# Patient Record
Sex: Female | Born: 1935 | Race: White | Hispanic: No | Marital: Single | Smoking: Never smoker
Health system: Southern US, Community
[De-identification: ages and names within clinical notes are randomized; demographics above are authoritative.]

## PROBLEM LIST (undated history)

## (undated) DIAGNOSIS — I10 Essential (primary) hypertension: Secondary | ICD-10-CM

## (undated) DIAGNOSIS — F329 Major depressive disorder, single episode, unspecified: Secondary | ICD-10-CM

## (undated) DIAGNOSIS — F32A Depression, unspecified: Secondary | ICD-10-CM

---

## 2016-06-10 ENCOUNTER — Encounter (INDEPENDENT_AMBULATORY_CARE_PROVIDER_SITE_OTHER): Payer: Self-pay | Admitting: Ophthalmology

## 2017-06-24 ENCOUNTER — Emergency Department (HOSPITAL_COMMUNITY): Payer: Medicare HMO

## 2017-06-24 ENCOUNTER — Encounter (HOSPITAL_COMMUNITY): Payer: Self-pay | Admitting: Emergency Medicine

## 2017-06-24 ENCOUNTER — Emergency Department (HOSPITAL_COMMUNITY)
Admission: EM | Admit: 2017-06-24 | Discharge: 2017-06-24 | Disposition: A | Discharge status: Home / Self Care | Payer: Medicare HMO | Attending: Emergency Medicine | Admitting: Emergency Medicine

## 2017-06-24 DIAGNOSIS — I110 Hypertensive heart disease with heart failure: Secondary | ICD-10-CM | POA: Insufficient documentation

## 2017-06-24 DIAGNOSIS — R0602 Shortness of breath: Secondary | ICD-10-CM | POA: Diagnosis present

## 2017-06-24 DIAGNOSIS — I5032 Chronic diastolic (congestive) heart failure: Secondary | ICD-10-CM

## 2017-06-24 DIAGNOSIS — Z79899 Other long term (current) drug therapy: Secondary | ICD-10-CM | POA: Diagnosis not present

## 2017-06-24 DIAGNOSIS — F329 Major depressive disorder, single episode, unspecified: Secondary | ICD-10-CM | POA: Insufficient documentation

## 2017-06-24 HISTORY — DX: Essential (primary) hypertension: I10

## 2017-06-24 HISTORY — DX: Depression, unspecified: F32.A

## 2017-06-24 HISTORY — DX: Major depressive disorder, single episode, unspecified: F32.9

## 2017-06-24 LAB — CBC WITH DIFFERENTIAL/PLATELET
Basophils Absolute: 0 10*3/uL (ref 0.0–0.1)
Basophils Relative: 0 %
EOS PCT: 1 %
Eosinophils Absolute: 0.1 10*3/uL (ref 0.0–0.7)
HCT: 35.3 % — ABNORMAL LOW (ref 36.0–46.0)
Hemoglobin: 11.3 g/dL — ABNORMAL LOW (ref 12.0–15.0)
LYMPHS ABS: 0.7 10*3/uL (ref 0.7–4.0)
Lymphocytes Relative: 12 %
MCH: 34 pg (ref 26.0–34.0)
MCHC: 32 g/dL (ref 30.0–36.0)
MCV: 106.3 fL — AB (ref 78.0–100.0)
MONOS PCT: 9 %
Monocytes Absolute: 0.6 10*3/uL (ref 0.1–1.0)
Neutro Abs: 4.8 10*3/uL (ref 1.7–7.7)
Neutrophils Relative %: 78 %
PLATELETS: 248 10*3/uL (ref 150–400)
RBC: 3.32 MIL/uL — AB (ref 3.87–5.11)
RDW: 18.2 % — ABNORMAL HIGH (ref 11.5–15.5)
WBC: 6.1 10*3/uL (ref 4.0–10.5)

## 2017-06-24 LAB — BASIC METABOLIC PANEL
Anion gap: 13 (ref 5–15)
BUN: 31 mg/dL — AB (ref 6–20)
CHLORIDE: 97 mmol/L — AB (ref 101–111)
CO2: 28 mmol/L (ref 22–32)
Calcium: 8.6 mg/dL — ABNORMAL LOW (ref 8.9–10.3)
Creatinine, Ser: 1.23 mg/dL — ABNORMAL HIGH (ref 0.44–1.00)
GFR calc Af Amer: 42 mL/min — ABNORMAL LOW (ref >60)
GFR calc non Af Amer: 37 mL/min — ABNORMAL LOW (ref >60)
Glucose, Bld: 121 mg/dL — ABNORMAL HIGH (ref 65–99)
POTASSIUM: 3.9 mmol/L (ref 3.5–5.1)
SODIUM: 138 mmol/L (ref 135–145)

## 2017-06-24 LAB — I-STAT TROPONIN, ED: Troponin i, poc: 0.03 ng/mL (ref 0.00–0.08)

## 2017-06-24 LAB — BRAIN NATRIURETIC PEPTIDE: B Natriuretic Peptide: 2383.9 pg/mL — ABNORMAL HIGH (ref 0.0–100.0)

## 2017-06-24 MED ORDER — POTASSIUM CHLORIDE CRYS ER 20 MEQ PO TBCR
40.0000 meq | EXTENDED_RELEASE_TABLET | Freq: Once | ORAL | Status: AC
Start: 2017-06-24 — End: 2017-06-24
  Administered 2017-06-24: 40 meq via ORAL
  Filled 2017-06-24: qty 2

## 2017-06-24 MED ORDER — FUROSEMIDE 20 MG PO TABS: 60.0000 mg | ORAL_TABLET | Freq: Three times a day (TID) | ORAL | 0 refills | Status: AC

## 2017-06-24 MED ORDER — FUROSEMIDE 10 MG/ML IJ SOLN
40.0000 mg | Freq: Once | INTRAMUSCULAR | Status: AC
  Administered 2017-06-24: 40 mg via INTRAVENOUS
  Filled 2017-06-24: qty 4

## 2017-06-24 NOTE — ED Provider Notes (Addendum)
[facility] EMERGENCY DEPARTMENT Provider Note   CSN: [id number] Arrival date & time: 06/24/17  1041     History   Chief Complaint Chief Complaint  Patient presents with  . Shortness of Breath    HPI Pamela Morrison is a 84 y.o. female with a past medical history of severe mitral regurgitation, CHF, hypertension, hyperlipidemia, CKD stage III, who presents to ED for evaluation of 2-day history of worsening shortness of breath and bilateral lower extremity edema.  Patient reports ongoing shortness of breath since hospitalization in January 2019 for acute pulmonary edema.  She states that shortness of breath is worse with "any type of movement."  Shortness of breath improves with rest.  She reports associated nasal congestion.  Denies any chest pain, hemoptysis, fever, dysuria, abdominal pain, vomiting.  Patient currently resides in assisted living facility.  She has been on oxygen via nasal cannula 2 L at all times since being discharged in January.  HPI  Past Medical History:  Diagnosis Date  . Depression   . Hypertension     There are no active problems to display for this patient.      OB History   None      Home Medications    Prior to Admission medications   Medication Sig Start Date End Date Taking? Authorizing Provider  brimonidine-timolol (COMBIGAN) 0.2-0.5 % ophthalmic solution Place 1 drop into the left eye every 12 (twelve) hours.   Yes [provider]  Cholecalciferol 5000 units TABS Take 5,000 Units by mouth daily.   Yes [provider]  dorzolamide (TRUSOPT) 2 % ophthalmic solution Place 1 drop into the right eye 2 (two) times daily.   Yes [provider]  DULoxetine (CYMBALTA) 30 MG capsule Take 30 mg by mouth daily.   Yes [provider]  ferrous sulfate 325 (65 FE) MG tablet Take 325 mg by mouth daily with breakfast.   Yes [provider]  folic acid (FOLVITE) 1 MG tablet Take 1 mg by mouth  daily.   Yes [provider]  furosemide (LASIX) 20 MG tablet Take 60 mg by mouth 2 (two) times daily.   Yes [provider]  lovastatin (MEVACOR) 20 MG tablet Take 20 mg by mouth at bedtime.   Yes [provider]  methotrexate (RHEUMATREX) 2.5 MG tablet Take 7.5 mg by mouth once a week. Caution:Chemotherapy. Protect from light. Pt takes on Thursday of each week   Yes [provider]  Multiple Vitamin (DAILY VITAMIN PO) Take 1 tablet by mouth daily.   Yes [provider]  Multiple Vitamins-Minerals (ICAPS PO) Take 1 capsule by mouth daily.   Yes [provider]  nebivolol (BYSTOLIC) 5 MG tablet Take 5 mg by mouth daily.   Yes [provider]  vitamin C (ASCORBIC ACID) 500 MG tablet Take 500 mg by mouth daily.   Yes [provider]    Family History No family history on file.  Social History Social History   Tobacco Use  . Smoking status: Never Smoker  . Smokeless tobacco: Never Used  Substance Use Topics  . Alcohol use: Not on file  . Drug use: Not on file     Allergies   Asa [aspirin]; Food; and Shrimp [shellfish allergy]   Review of Systems Review of Systems  Constitutional: Negative for appetite change, chills and fever.  HENT: Positive for congestion. Negative for ear pain, rhinorrhea, sneezing and sore throat.   Eyes: Negative for photophobia and  visual disturbance.  Respiratory: Positive for shortness of breath. Negative for cough, chest tightness and wheezing.   Cardiovascular: Negative for chest pain and palpitations.  Gastrointestinal: Negative for abdominal pain, blood in stool, constipation, diarrhea, nausea and vomiting.  Genitourinary: Negative for dysuria, hematuria and urgency.  Musculoskeletal: Negative for myalgias.  Skin: Negative for rash.  Neurological: Negative for dizziness, weakness and light-headedness.     Physical Exam Updated Vital Signs BP 105/76   Pulse 87   Temp 98.1  F (36.7 C)   Resp (!) 25   SpO2 93%   Physical Exam  Constitutional: She is oriented to person, place, and time. She appears well-developed and well-nourished. No distress.  Oxygen being delivered via nasal cannula.  HENT:  Head: Normocephalic and atraumatic.  Nose: Nose normal.  Eyes: Pupils are equal, round, and reactive to light. Conjunctivae and EOM are normal. Right eye exhibits no discharge. Left eye exhibits no discharge. No scleral icterus.  Neck: Normal range of motion. Neck supple.  Cardiovascular: Normal rate, regular rhythm, normal heart sounds and intact distal pulses. Exam reveals no gallop and no friction rub.  No murmur heard. Pulmonary/Chest: Effort normal and breath sounds normal. No respiratory distress.  Coarse breath sounds in bilateral lung fields.  Abdominal: Soft. Bowel sounds are normal. She exhibits no distension. There is no tenderness. There is no guarding.  Musculoskeletal: Normal range of motion.       Right lower leg: She exhibits edema.       Left lower leg: She exhibits edema.  Mild 1+ pitting edema in BLE, R slightly > L. No calf tenderness bilaterally.  Neurological: She is alert and oriented to person, place, and time. No cranial nerve deficit or sensory deficit. She exhibits normal muscle tone. Coordination normal.  Alert and oriented to self only. Patient states, "I haven't written a check since before January, so how would I know what day it is?"  PERRL.  No facial asymmetry noted.  Skin: Skin is warm and dry. No rash noted.  Psychiatric: She has a normal mood and affect.  Nursing note and vitals reviewed.    ED Treatments / Results  Labs (all labs ordered are listed, but only abnormal results are displayed) Labs Reviewed  BASIC METABOLIC PANEL - Abnormal; Notable for the following components:      Result Value   Chloride 97 (*)    Glucose, Bld 121 (*)    BUN 31 (*)    Creatinine, Ser 1.23 (*)    Calcium 8.6 (*)    GFR calc non Af Amer  37 (*)    GFR calc Af Amer 42 (*)    All other components within normal limits  CBC WITH DIFFERENTIAL/PLATELET - Abnormal; Notable for the following components:   RBC 3.32 (*)    Hemoglobin 11.3 (*)    HCT 35.3 (*)    MCV 106.3 (*)    RDW 18.2 (*)    All other components within normal limits  BRAIN NATRIURETIC PEPTIDE - Abnormal; Notable for the following components:   B Natriuretic Peptide 2,383.9 (*)    All other components within normal limits  URINALYSIS, ROUTINE W REFLEX MICROSCOPIC  I-STAT TROPONIN, ED    EKG EKG Interpretation  Date/Time:  Friday Jun 24 2017 10:49:01 EDT Ventricular Rate:  84 PR Interval:    QRS Duration: 82 QT Interval:  441 QTC Calculation: 522 R Axis:   162 Text Interpretation:  Atrial fibrillation Probable lateral infarct, age indeterminate Prolonged QT  interval Confirmed by Raeford Razor [phone]) on 06/24/2017 12:10:38 PM   Radiology Dg Chest 2 View  Result Date: 06/24/2017 CLINICAL DATA:  Intermittent shortness of breath over the last several months, worsening recently. EXAM: CHEST - 2 VIEW COMPARISON:  03/05/2017 and multiple priors as distant as 04/19/2014 FINDINGS: Artifact overlies the chest. Chronic cardiomegaly and aortic atherosclerosis. Chronic diffuse interstitial lung disease, baseline appearance without evidence of superimposed acute consolidation, collapse or effusion. Chronic degenerative changes affect the spine with old compression fractures at the thoracolumbar junction. IMPRESSION: No active disease identifiable by radiography. Chronic cardiomegaly and aortic atherosclerosis. Chronic widespread interstitial lung disease. Electronically Signed   By: Paulina Fusi M.D.   On: 06/24/2017 12:47    Procedures Procedures (including critical care time)  Medications Ordered in ED Medications  potassium chloride SA (K-DUR,KLOR-CON) CR tablet 40 mEq (has no administration in time range)  furosemide (LASIX) injection 40 mg (40 mg Intravenous  Given 06/24/17 1414)     Initial Impression / Assessment and Plan / ED Course  I have reviewed the triage vital signs and the nursing notes.  Pertinent labs & imaging results that were available during my care of the patient were reviewed by me and considered in my medical decision making (see chart for details).     Patient presents to ED for evaluation of Shortness of breath, bilateral lower extremity edema.  Patient's symptoms began earlier this year in January where she had to be admitted to [facility] for pulmonary edema, acute on chronic kidney disease, elevation in troponin.  Since then she now resides in an assisted living facility and no longer at home.  Patient is currently on 20 mg of Lasix twice daily for his CHF.  On physical exam she is overall well-appearing.  She is on 2 L of oxygen via nasal cannula.  She is not tachycardic or tachypneic.  She has bilateral lower extremity edema with no calf tenderness.  Lab work shows normal troponin, BNP elevated at 2000.  CBC unremarkable.  BMP with normal potassium and otherwise unremarkable as well.  EKG shows atrial fibrillation, which patient has a history of. Patient given 40 mg of IV Lasix.  She currently takes a total of 120 mg of Lasix p.o. daily.  This was increased last month from 80 mg. Will ask to increase her Lasix  three times daily. She has an appointment with her cardiologist next week.  Advised to return for any severe worsening symptoms. Patient discussed with and seen by my attending, Dr. Juleen China.  Portions of this note were generated with Scientist, clinical (histocompatibility and immunogenetics). Dictation errors may occur despite best attempts at proofreading.    Final Clinical Impressions(s) / ED Diagnoses   Final diagnoses:  Chronic diastolic congestive heart failure [facility])    ED Discharge Orders    None        Dietrich Pates, PA-C 06/24/17 1514    Raeford Razor, MD 06/27/17 [phone]

## 2017-06-24 NOTE — ED Notes (Signed)
Pt given ginger ale , son and wife here to speak to PA

## 2017-06-24 NOTE — ED Triage Notes (Signed)
Sob ankle swelling x 3 days a fib per ems, cannot complete full sentence  Sob no fever

## 2017-06-24 NOTE — Discharge Instructions (Addendum)
Take  Lasix 3 times daily for the next 4 days. Follow-up with your primary care provider and cardiologist for further evaluation.

## 2017-06-24 NOTE — ED Notes (Signed)
[facility] arrived to transport patient. All documents given to [facility]. Family notified

## 2017-06-24 NOTE — ED Notes (Signed)
CALLED [facility]  

## 2017-06-24 NOTE — ED Notes (Signed)
[facility] called for report- [facility] called to transport patient

## 2017-06-24 NOTE — ED Notes (Signed)
[facility] Called 

## 2017-07-16 DEATH — deceased

## 2018-10-03 IMAGING — CR DG CHEST 2V
2 series · 2 of 2 positions shown · non-contrast
Comparison: 03/05/2017 and multiple priors as distant as 04/19/2014

CLINICAL DATA: Intermittent shortness of breath over the last
several months, worsening recently.

EXAM:
CHEST - 2 VIEW

[chest lat]
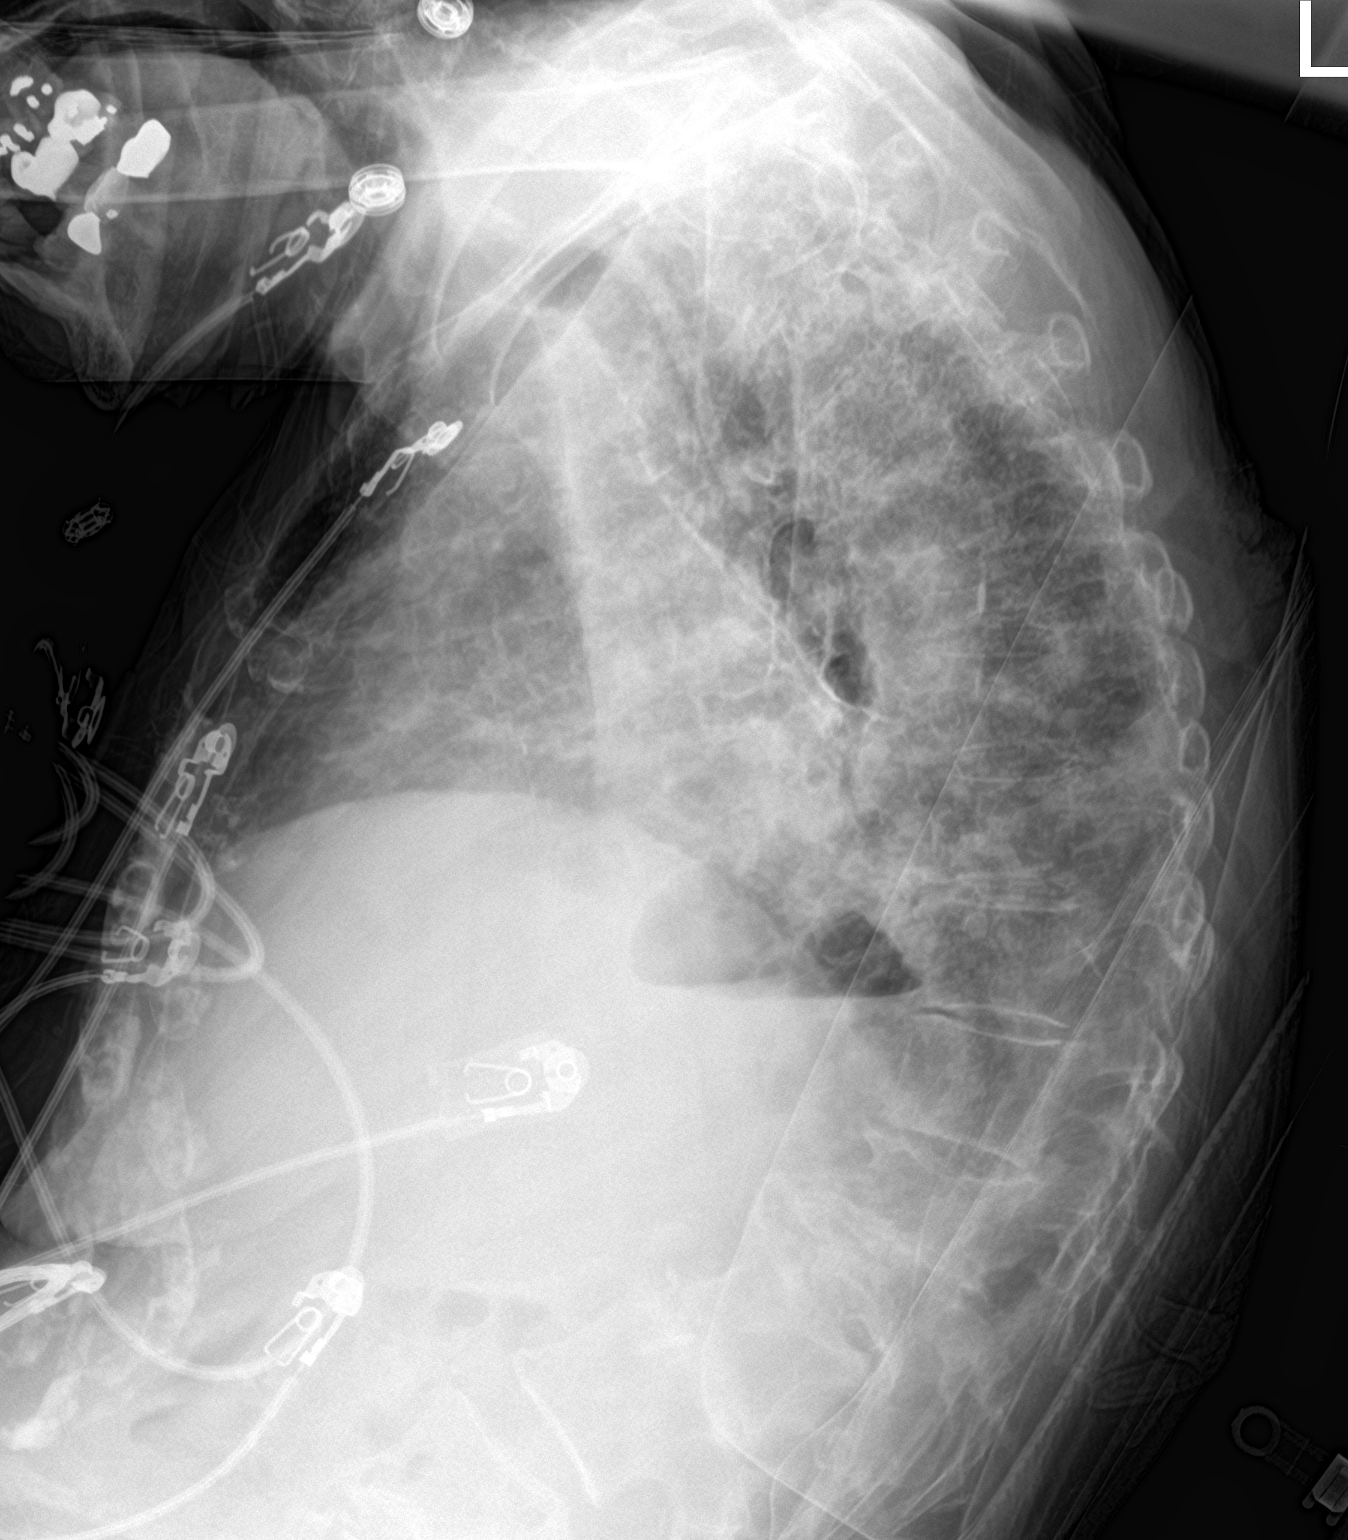

[chest ap]
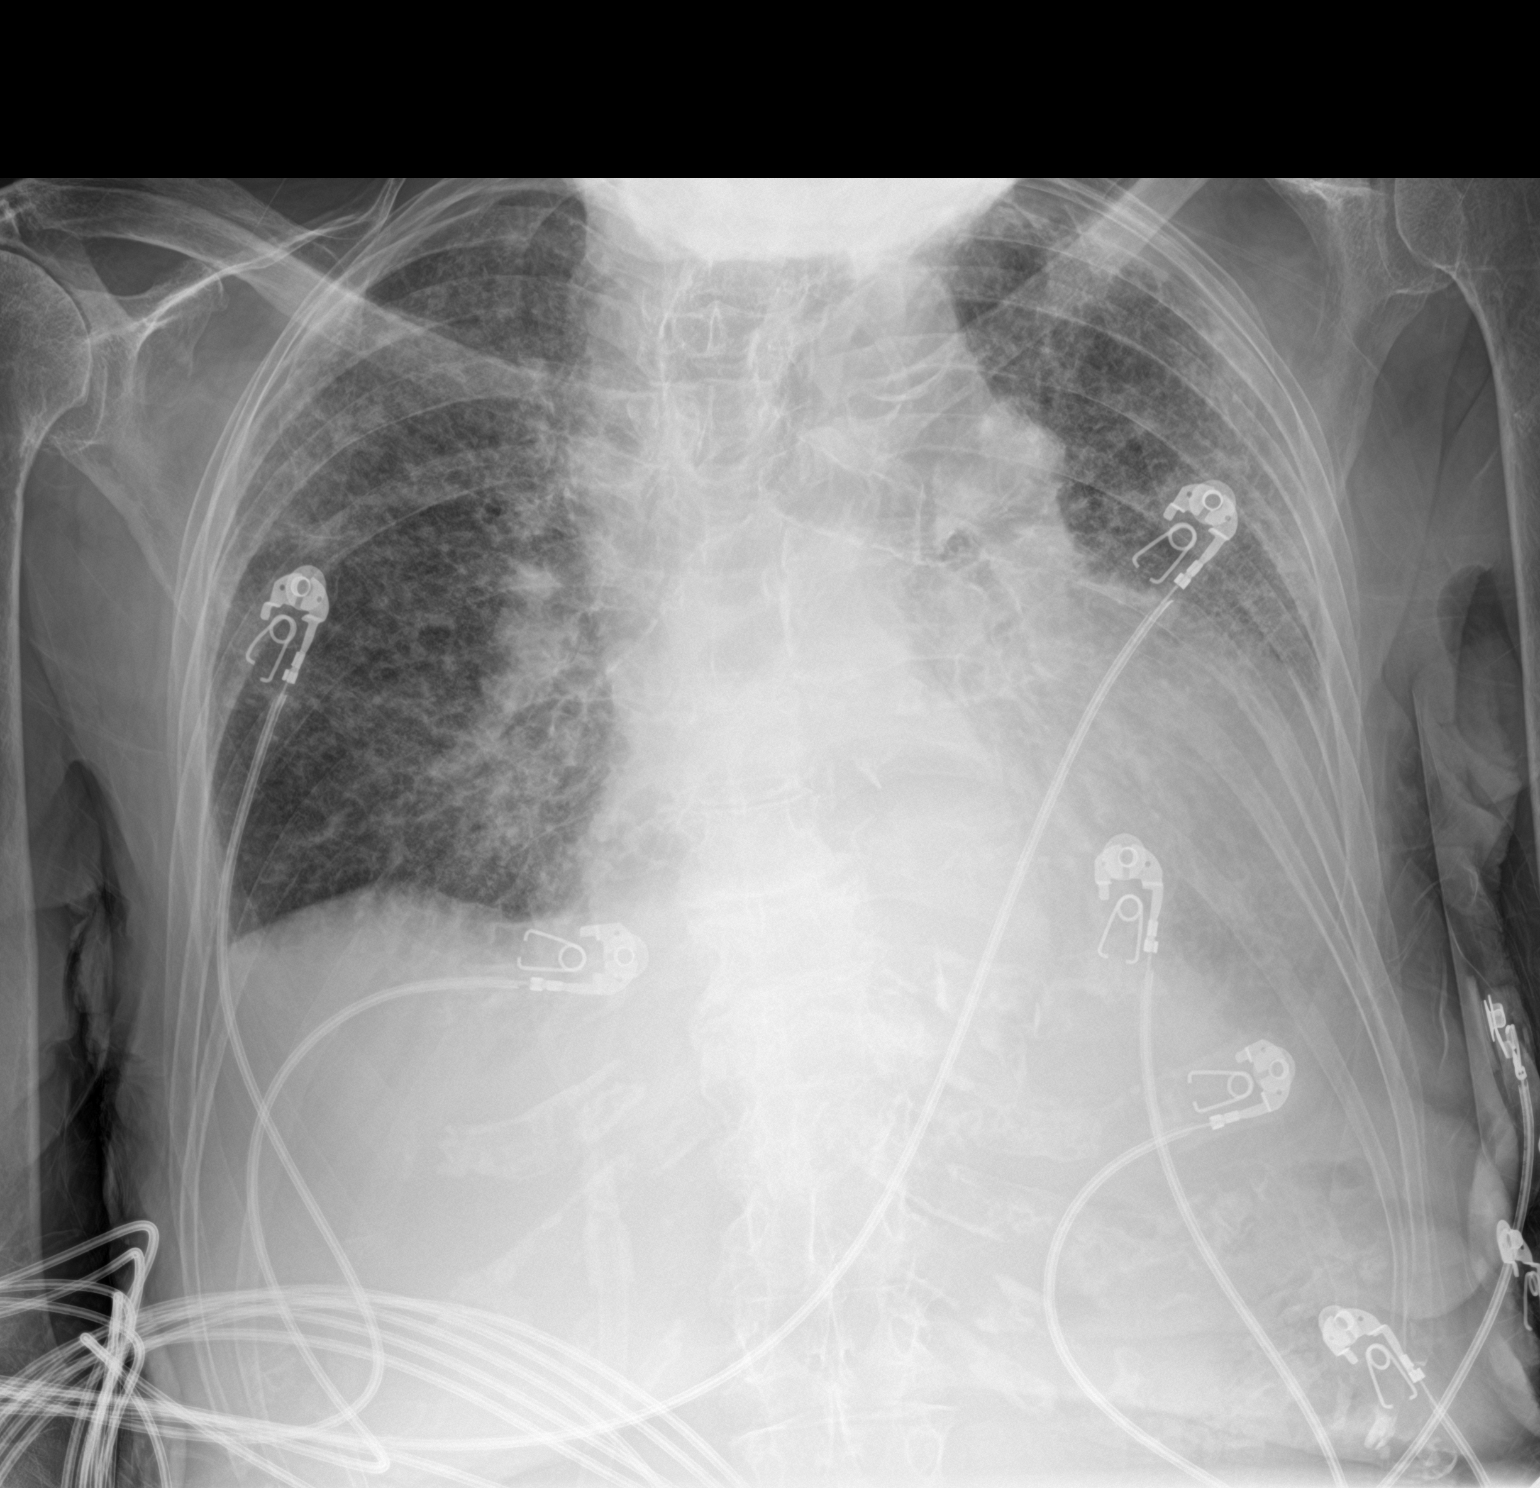

[2 of 2 positions shown; findings below may reference images not displayed]

FINDINGS: Artifact overlies the chest. Chronic cardiomegaly and aortic
atherosclerosis. Chronic diffuse interstitial lung disease, baseline
appearance without evidence of superimposed acute consolidation,
collapse or effusion. Chronic degenerative changes affect the spine
with old compression fractures at the thoracolumbar junction.
IMPRESSION: No active disease identifiable by radiography. Chronic cardiomegaly
and aortic atherosclerosis. Chronic widespread interstitial lung
disease.
